# Patient Record
Sex: Female | Born: 1970 | Race: White | Hispanic: No
Health system: Southern US, Community
[De-identification: ages and names within clinical notes are randomized; demographics above are authoritative.]

## PROBLEM LIST (undated history)

## (undated) DIAGNOSIS — Z801 Family history of malignant neoplasm of trachea, bronchus and lung: Secondary | ICD-10-CM

## (undated) DIAGNOSIS — Z800 Family history of malignant neoplasm of digestive organs: Secondary | ICD-10-CM

## (undated) DIAGNOSIS — Z8049 Family history of malignant neoplasm of other genital organs: Secondary | ICD-10-CM

## (undated) DIAGNOSIS — Z8051 Family history of malignant neoplasm of kidney: Secondary | ICD-10-CM

## (undated) DIAGNOSIS — Z808 Family history of malignant neoplasm of other organs or systems: Secondary | ICD-10-CM

## (undated) DIAGNOSIS — Z803 Family history of malignant neoplasm of breast: Secondary | ICD-10-CM

## (undated) HISTORY — DX: Family history of malignant neoplasm of trachea, bronchus and lung: Z80.1

## (undated) HISTORY — DX: Family history of malignant neoplasm of other organs or systems: Z80.8

## (undated) HISTORY — DX: Family history of malignant neoplasm of other genital organs: Z80.49

## (undated) HISTORY — DX: Family history of malignant neoplasm of digestive organs: Z80.0

## (undated) HISTORY — DX: Family history of malignant neoplasm of kidney: Z80.51

## (undated) HISTORY — DX: Family history of malignant neoplasm of breast: Z80.3

---

## 2001-05-18 ENCOUNTER — Other Ambulatory Visit: Admission: RE | Admit: 2001-05-18 | Discharge: 2001-05-18 | Payer: Self-pay | Admitting: *Deleted

## 2001-08-26 ENCOUNTER — Emergency Department (HOSPITAL_COMMUNITY): Admission: EM | Admit: 2001-08-26 | Discharge: 2001-08-26 | Payer: Self-pay | Admitting: Emergency Medicine

## 2001-08-27 ENCOUNTER — Encounter: Payer: Self-pay | Admitting: Emergency Medicine

## 2003-01-04 ENCOUNTER — Other Ambulatory Visit: Admission: RE | Admit: 2003-01-04 | Discharge: 2003-01-04 | Payer: Self-pay | Admitting: Obstetrics and Gynecology

## 2003-04-23 ENCOUNTER — Inpatient Hospital Stay (HOSPITAL_COMMUNITY): Admission: AD | Admit: 2003-04-23 | Discharge: 2003-04-23 | Payer: Self-pay | Admitting: Obstetrics and Gynecology

## 2003-07-29 ENCOUNTER — Inpatient Hospital Stay (HOSPITAL_COMMUNITY): Admission: AD | Admit: 2003-07-29 | Discharge: 2003-07-29 | Payer: Self-pay | Admitting: Obstetrics and Gynecology

## 2003-07-29 ENCOUNTER — Inpatient Hospital Stay (HOSPITAL_COMMUNITY): Admission: AD | Admit: 2003-07-29 | Discharge: 2003-07-31 | Payer: Self-pay | Admitting: Obstetrics and Gynecology

## 2004-04-07 ENCOUNTER — Other Ambulatory Visit: Admission: RE | Admit: 2004-04-07 | Discharge: 2004-04-07 | Payer: Self-pay | Admitting: Obstetrics and Gynecology

## 2005-04-07 ENCOUNTER — Other Ambulatory Visit: Admission: RE | Admit: 2005-04-07 | Discharge: 2005-04-07 | Payer: Self-pay | Admitting: Obstetrics and Gynecology

## 2006-09-22 ENCOUNTER — Other Ambulatory Visit: Admission: RE | Admit: 2006-09-22 | Discharge: 2006-09-22 | Payer: Self-pay | Admitting: Obstetrics and Gynecology

## 2008-09-10 ENCOUNTER — Encounter (INDEPENDENT_AMBULATORY_CARE_PROVIDER_SITE_OTHER): Payer: Self-pay | Admitting: Obstetrics and Gynecology

## 2008-09-10 ENCOUNTER — Ambulatory Visit (HOSPITAL_COMMUNITY): Admission: RE | Admit: 2008-09-10 | Discharge: 2008-09-10 | Payer: Self-pay | Admitting: Obstetrics and Gynecology

## 2011-05-05 NOTE — Op Note (Signed)
Penny, Petersen        ACCOUNT NO.:  [id number]   MEDICAL RECORD NO.:  [id number]          PATIENT TYPE:  AMB   LOCATION:  SDC                           [facility]ILITY:  [facility]   PHYSICIAN:  Dois Davenport A. Rivard, M.D. DATE OF BIRTH:  March 22, 1971   DATE OF PROCEDURE:  09/10/2008  DATE OF DISCHARGE:                               OPERATIVE REPORT   PREOPERATIVE DIAGNOSIS:  Menometrorrhagia.   POSTOPERATIVE DIAGNOSIS:  Menometrorrhagia.   ANESTHESIA:  General by Dr. Pamalee Leyden.   PROCEDURE:  Hysteroscopy with endometrial resection and dilation and  curettage.   SURGEON:  Crist Fat. Rivard, MD   ASSISTANT:  None.   ESTIMATED BLOOD LOSS:  Minimal.   PROCEDURE:  After being informed of the planned procedure with possible  complications including bleeding, infection, and injury to uterus,  informed consent is obtained.  The patient was taken to OR #3, given  general anesthesia with mask ventilation.  She was placed in the  lithotomy position, prepped and draped in a sterile fashion.  Her  bladder was emptied with an in-and-out Foley catheter.  GYN exam reveals  an anteverted uterus, normal in size and shape and two normal adnexa.  A  weighted speculum was inserted.  Anterior lip of the cervix was grasped  with a tenaculum forceps and we proceed with a paracervical block using  Novocain 1% 20 mL in the usual fashion.  Uterus was then sounded at 8-cm  and the cervix was easily dilated using Hegar dilator until #31, which  allows easy entry of a operative hysteroscope.  With perfusion of  sorbitol at a maximum pressure of 80 mmHg, we were able to visualize the  entire endometrial cavity, which reveals two normal tubal ostia.  We do  not identify a polyp, but we do identified discrepancy between the  anterior wall endometrium, which appears much thicker than the rest of  the endometrium and also more vascularized.  Because of that we decide  to resect two strips of this endometrium to sent  separately using the  loop resectoscope.  Once this was completed, instruments were removed  and we proceed with a sharp curette of the endometrial cavity removing a  moderate amount of normal-appearing endometrium.  Hysteroscope was  reinserted to look at the uterine cavity, which was free of debris and  no active bleeding.  Instruments were removed.  Instruments and sponge  count is complete x2.  Estimated blood loss was minimal.  Water deficit was 40 mL.  The procedure was very well tolerated by the  patient who was taken to recovery room in a well and stable condition.   SPECIMEN:  Endometrial resection and endometrial curettings sent to  pathology.      Crist Fat Rivard, M.D.  Electronically Signed     SAR/MEDQ  D:  09/10/2008  T:  09/10/2008  Job:  [id number]

## 2011-05-08 NOTE — H&P (Signed)
NAME:  Penny Petersen, Penny Petersen                        ACCOUNT NO.:  [id number]   MEDICAL RECORD NO.:  [id number]                   PATIENT TYPE:  INP   LOCATION:  9119                                 [facility]ACILITY:  [facility]   PHYSICIAN:  Hal Morales, M.D.             DATE OF BIRTH:  May 24, 1971   DATE OF ADMISSION:  07/29/2003  DATE OF DISCHARGE:                                HISTORY & PHYSICAL   HISTORY:  [name] is a 40 year old gravida 3, para 2, 0, 0, 2 who  presents in active labor.  She was seen in maternity admissions earlier this  evening for labor evaluation and was discharged home after no cervical  change with ambulation times one hour.  She was 2 cm at that time.  Fetal  heart rate was reactive.  Her labor progressed quickly at home and she  returned to the hospital at approximately 11:30 p.m. at 5 cm dilated.  She  reported positive fetal movement, no bleeding, no rupture of membranes, no  PIH symptoms.  Her pregnancy was followed by the CNM service at [facility] in room  number one.   1. Intrauterine insemination for pregnancy with anonymous donor.  2. Rh negative.  3. History of abnormal Pap and cryotherapy.  4. Smoker.  5. Group B strep negative.   This patient was initially evaluated at the office of [facility] on November 30, 2002 at approximately eight weeks gestation.  EDC determined by dates.  Pregnancy was accomplished with intrauterine insemination for a female  partner couple with an anonymous donor.  Her pregnancy has been essentially  unremarkable.  She has been size equal to dates throughout.  Normotensive  with no proteinuria.   PRENATAL LAB WORK:  On January 05, 2003: Hemoglobin and hematocrit 13.2 and  39.0, platelets 262,000.  Blood type and Rh O negative, antibody screen  negative.  VDRL nonreactive.  Rubella immune.  Hepatitis B surface antigen  negative.  HIV declined.  CF testing declined.  Pap smear within normal  limits.  GC and Chlamydia negative.  Quad  screen within normal range.  One  hour glucose challenge 163, three hour GTT within normal limits.  Hemoglobin  at 28 weeks 12.1.  At 36 weeks culture of the vaginal tract was negative for  group B strep.   OB HISTORY:  In 1990 patient had a normal spontaneous vaginal delivery with  birth of a 7 pound, 10 ounce female infant at term named Penny Petersen with no  complications; in 1993 patient had a normal spontaneous vaginal delivery  with birth of an 8 pound, 2 ounce female infant named Penny Petersen at term with no  complications, and the present pregnancy.  As stated above patient use  Clomid and achieved pregnancy with intrauterine insemination.  She has a  history of abnormal Pap smear and cryotherapy.  History of Chlamydia in 1989  which was treated.   FAMILY HISTORY:  Maternal grandfather with a history of heart disease.  Maternal aunt with a history of positive PPD and INH treatments.  Maternal  grandmother with diabetes and anemia and she receives vitamin B12 shots.  Patient's mother with hypothyroidism.  The patient's mother diagnosed with  questionable rash, question of autoimmune disease.  Paternal grandfather -  lung cancer.  Maternal grandfather - skin cancer.  The patient's father with  skin cancer lesions on chest.  The patient has a half-brother with epilepsy.  The patient's mother and brother with depression.   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  Wisdom teeth.   GENETIC HISTORY:  The patient's half-brother with cerebral palsy and mental  retardation.  Patient's half-brother with extra fingers.  Maternal aunt born  without one kidney.   HABITS:  The patient smokes one to three cigarettes a day and denies use of  alcohol or illicit drugs.   SOCIAL HISTORY:  [name] is a 40 year old Caucasian female who has a  female partner named Penny Petersen who is very supportive.  They are [place] in  their faith.    ALLERGIES:  The patient has no known drug allergies.   REVIEW OF SYSTEMS:  The patient was admitted to the hospital in active labor  at 5 cm and quickly progressed to normal spontaneous vaginal delivery with  the birth of a viable female infant named Penny Petersen, with Apgar's scores of 8  at one minute and 9 at five minutes.   PHYSICAL EXAMINATION:  VITAL SIGNS: Stable.  Afebrile.  HEENT: Unremarkable.  HEART: Regular rate and rhythm.  LUNGS: Clear.  ABDOMEN: Gravid in its contour.  PELVIC EXAM: Uterine fundus was noted to extend 39 cm above the level of the  pubic symphysis, Leopold's maneuvers found the infant to be in a  longitudinal lie, cephalic presentation and the estimated fetal weight was  7.5 pounds.  The fetal heart rate was in the 130's with positive  variability, positive accelerations.  The patient was contracting regularly  every two to three minutes.  Spontaneous rupture of membranes occurred at  0025 Hr. With clear fluid.  The patient immediately experienced a strong  urge to push and with two pushes went into normal spontaneous vaginal  delivery.   DIAGNOSIS:  Intrauterine pregnancy at term, delivered.   PROCEDURE:  Normal spontaneous vaginal delivery.   PLAN:  Admit with routine orders.  Mother and infant in stable condition.     Rica Koyanagi, C.N.M.               Hal Morales, M.D.    SDM/MEDQ  D:  07/30/2003  T:  07/30/2003  Job:  [id number]

## 2011-09-21 LAB — CBC
HCT: 45.2
Hemoglobin: 15
MCHC: 33.3
MCV: 101.2 — ABNORMAL HIGH
Platelets: 288
RBC: 4.46
RDW: 11.9
WBC: 5.8

## 2011-09-21 LAB — PREGNANCY, URINE: Preg Test, Ur: NEGATIVE

## 2019-01-20 ENCOUNTER — Other Ambulatory Visit: Payer: Self-pay | Admitting: Obstetrics and Gynecology

## 2019-01-20 DIAGNOSIS — R7989 Other specified abnormal findings of blood chemistry: Secondary | ICD-10-CM

## 2019-01-20 DIAGNOSIS — R891 Abnormal level of hormones in specimens from other organs, systems and tissues: Principal | ICD-10-CM

## 2019-01-24 ENCOUNTER — Other Ambulatory Visit: Payer: Self-pay | Admitting: Obstetrics and Gynecology

## 2019-01-24 DIAGNOSIS — R891 Abnormal level of hormones in specimens from other organs, systems and tissues: Secondary | ICD-10-CM

## 2019-01-26 ENCOUNTER — Ambulatory Visit
Admission: RE | Admit: 2019-01-26 | Discharge: 2019-01-26 | Disposition: A | Discharge status: Home / Self Care | Payer: Self-pay | Source: Ambulatory Visit | Attending: Obstetrics and Gynecology | Admitting: Obstetrics and Gynecology

## 2019-01-26 DIAGNOSIS — R891 Abnormal level of hormones in specimens from other organs, systems and tissues: Secondary | ICD-10-CM

## 2019-01-26 MED ORDER — IOPAMIDOL (ISOVUE-370) INJECTION 76%
75.0000 mL | Freq: Once | INTRAVENOUS | Status: AC | PRN
  Administered 2019-01-26: 75 mL via INTRAVENOUS

## 2019-07-08 IMAGING — CT CT HEAD WO/W CM
4 of 5 series · 16 of 47 positions shown, 18 images · IV contrast (iopamidol)
Comparison: None.

CLINICAL DATA: Elevated prolactin level.

EXAM:
CT HEAD WITHOUT AND WITH CONTRAST
TECHNIQUE: Contiguous axial images were obtained from the base of the skull
through the vertex without and with intravenous contrast
CONTRAST:  75mL O1VNQ3-S24 IOPAMIDOL (O1VNQ3-S24) INJECTION 76%

[Series 2: brain 5.00 hr40 s3 ibhc · axial · 0.39mm/px · z∈[-586,-471]mm · 8 of 31 slices shown, 10 images]
[im 4/31  brain]
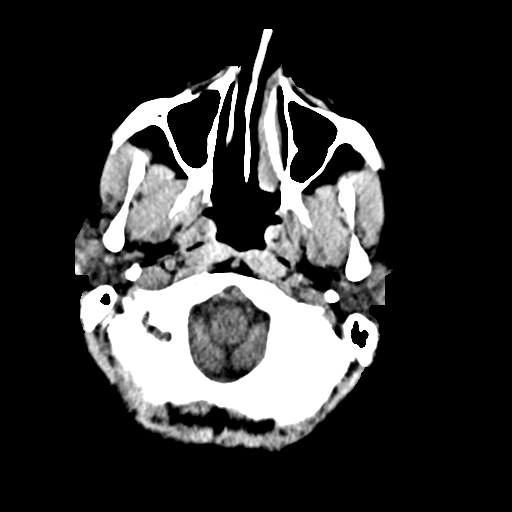
[im 4/31  bone]
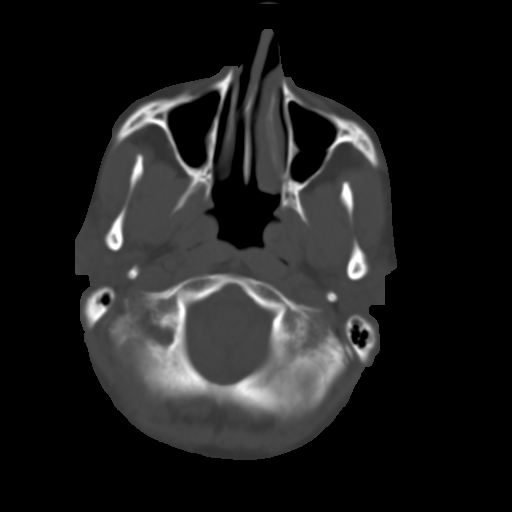
[im 7/31  brain]
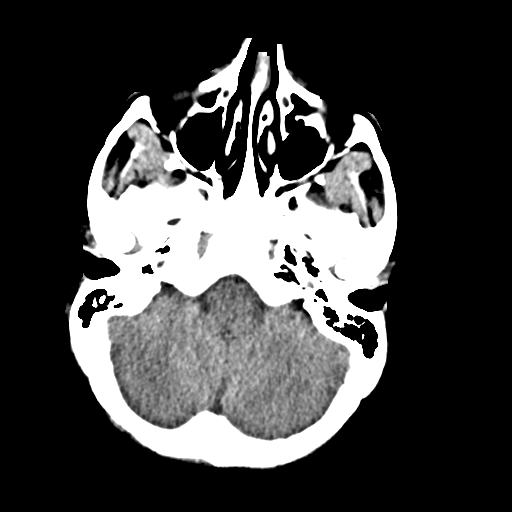
[im 11/31  brain]
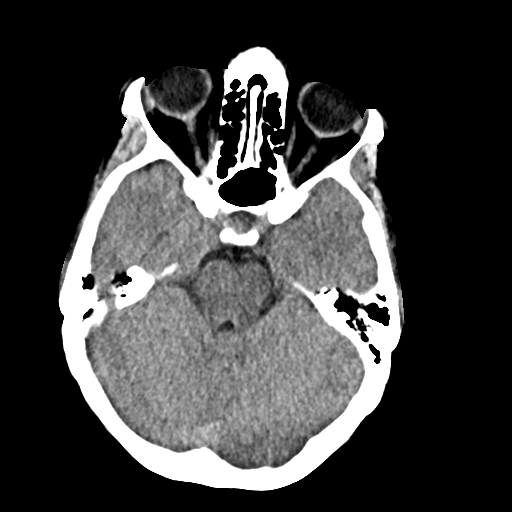
[im 14/31  brain]
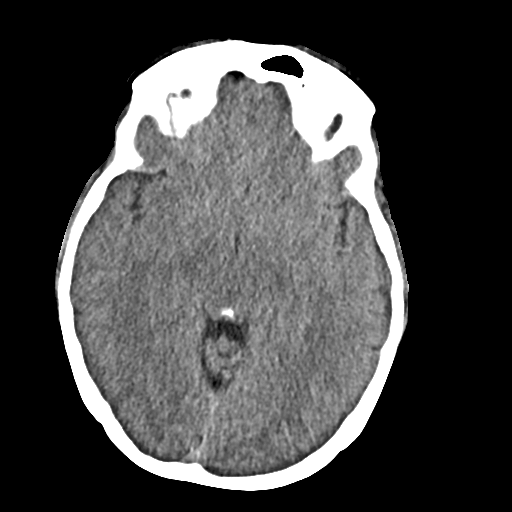
[im 17/31  brain]
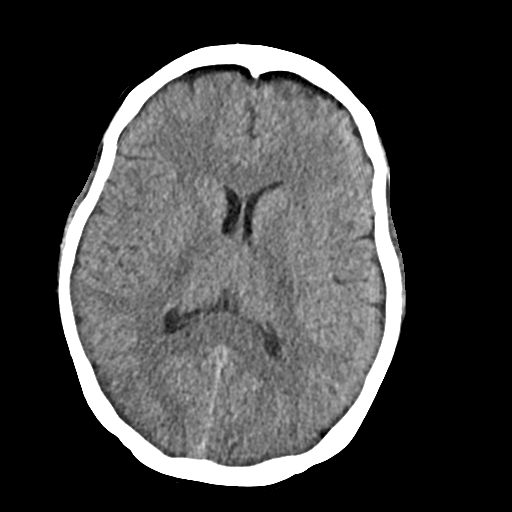
[im 17/31  bone]
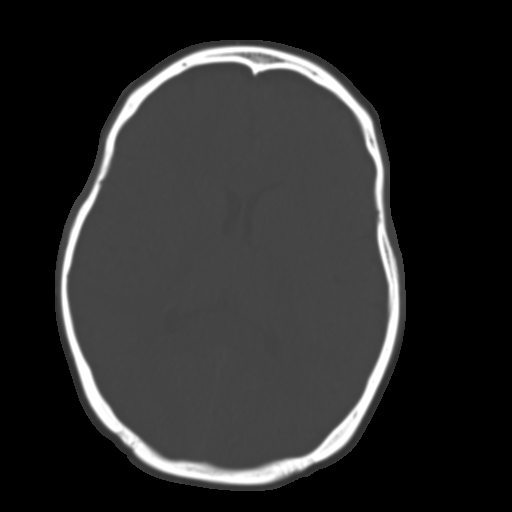
[im 21/31  brain]
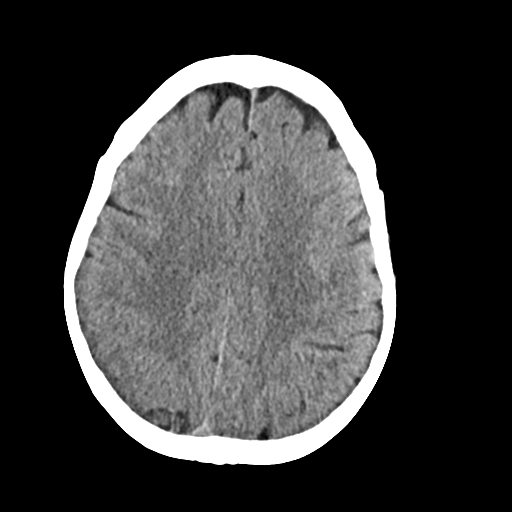
[im 24/31  brain]
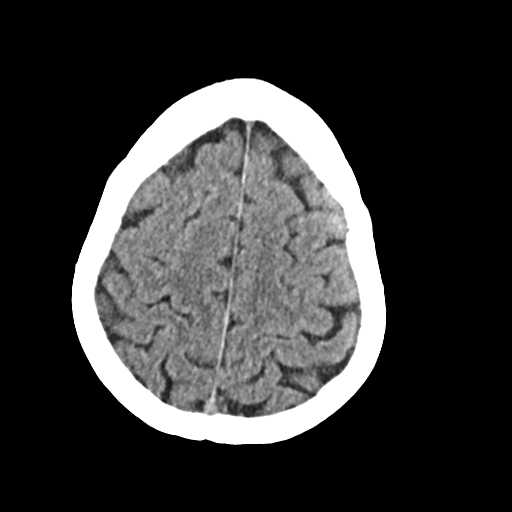
[im 27/31  brain]
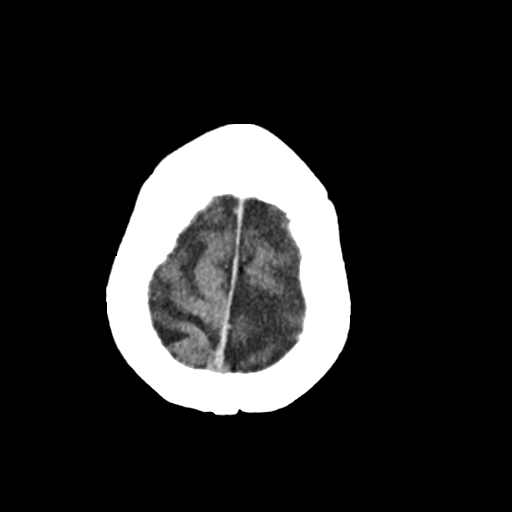

[Series 3: brain 5.00 hr60 s3 · axial · 0.39mm/px · z∈[-586,-571]mm · 2 of 31 slices shown]
[im 4/31  brain]
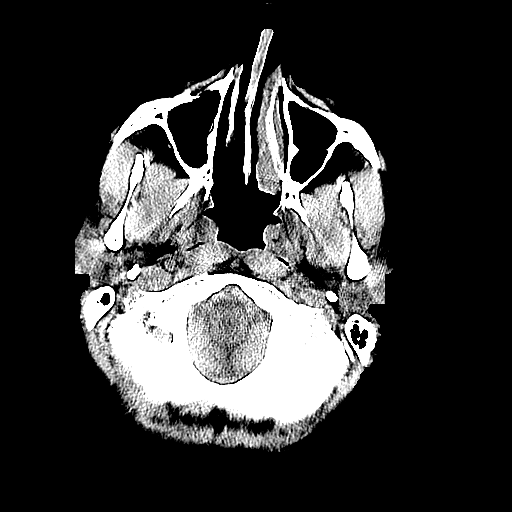
[im 7/31  brain]
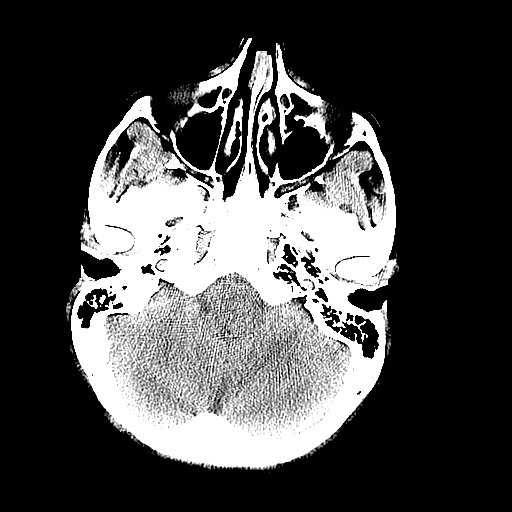

[Series 5: brain 3.00 hr40 s3 cor ibhc · coronal · 0.30mm/px · 3 of 65 slices shown]
[im 22/65  brain]
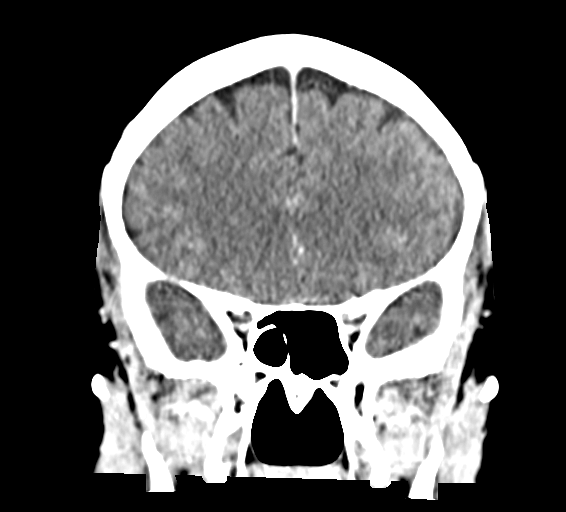
[im 29/65  brain]
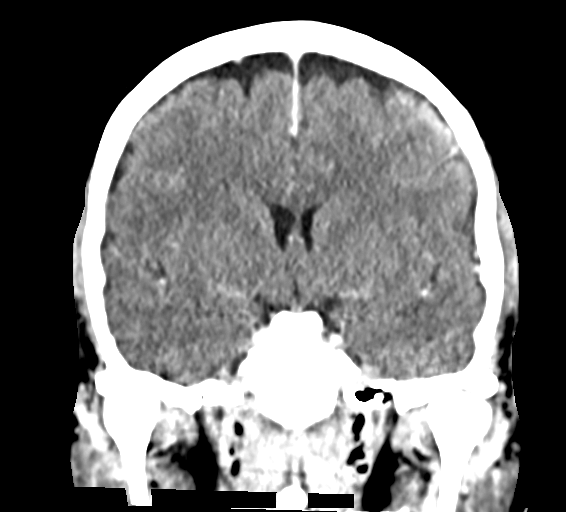
[im 36/65  brain]
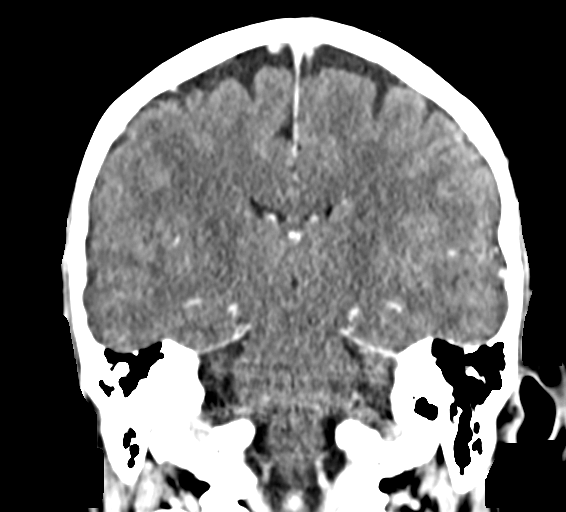

[Series 7: brain 3.00 hr40 s3 sag ibhc · sagittal · 0.30mm/px · 3 of 56 slices shown]
[im 19/56  brain]
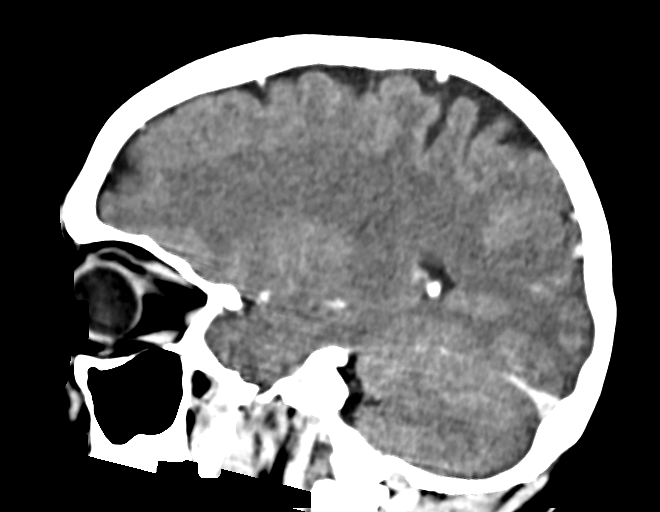
[im 28/56  brain]
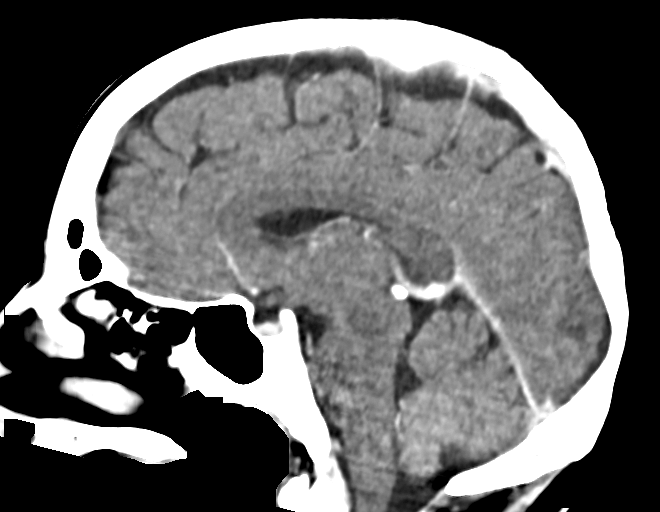
[im 37/56  brain]
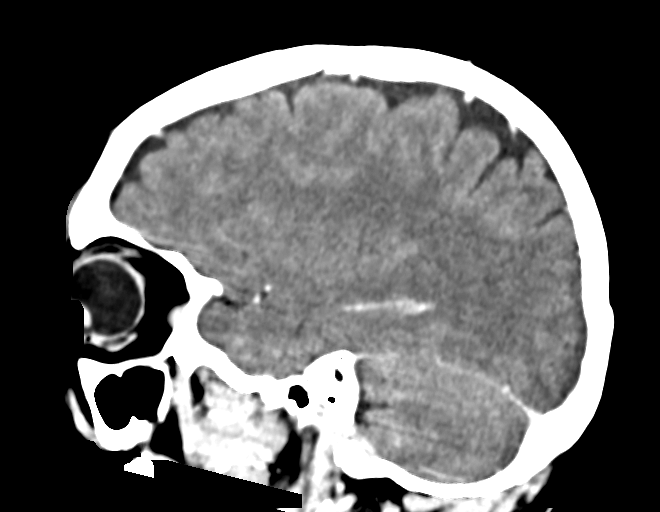

[16 of 47 positions shown; findings below may reference images not displayed]

FINDINGS: Brain: There is no evidence of acute infarct, intracranial
hemorrhage, mass, midline shift, or extra-axial fluid collection.
The ventricles and sulci are normal.

The pituitary gland is normal in size, and no sellar or suprasellar
mass is identified. Assessment for a small pituitary microadenoma is
limited on CT. The infundibulum is midline.

Vascular: Major dural venous sinuses and large arteries at the base
of the brain are grossly patent.

Skull: No fracture or focal osseous lesion.

Sinuses/Orbits: Unremarkable orbits. No evidence of significant
paranasal sinus inflammatory disease or significant mastoid fluid.

Other: Unremarkable.
IMPRESSION: Negative head CT.

Overall normal pituitary size without a pituitary macroadenoma or
other sellar region mass identified. Limited assessment for
microadenoma on CT with pituitary protocol brain MRI being the
preferred examination.

## 2019-11-07 ENCOUNTER — Telehealth: Payer: Self-pay | Admitting: Genetic Counselor

## 2019-11-07 NOTE — Telephone Encounter (Signed)
I received a genetic counseling referral from Dr. Collene Mares at [facility] for a family history of malignant neoplasm of digestive organs. Mrs. Penny Petersen has been cld and scheduled to see Raquel Sarna on 11/24 at [facility]. Pt aware to arrive 15 minutes early.

## 2019-11-10 ENCOUNTER — Telehealth: Payer: Self-pay | Admitting: Genetic Counselor

## 2019-11-10 NOTE — Telephone Encounter (Signed)
Called patient regarding upcoming Web[facility] appointment, per patient's request this will be a walk-in visit. °

## 2019-11-14 ENCOUNTER — Inpatient Hospital Stay: Payer: BC Managed Care – PPO | Admitting: Genetic Counselor

## 2019-11-14 ENCOUNTER — Other Ambulatory Visit: Payer: Self-pay | Admitting: Genetic Counselor

## 2019-11-14 ENCOUNTER — Telehealth: Payer: Self-pay | Admitting: Genetic Counselor

## 2019-11-14 ENCOUNTER — Inpatient Hospital Stay: Payer: BC Managed Care – PPO

## 2019-11-14 DIAGNOSIS — Z809 Family history of malignant neoplasm, unspecified: Secondary | ICD-10-CM

## 2019-11-14 NOTE — Telephone Encounter (Signed)
Returned patient's phone call regarding rescheduling 11/24 appointment, per patient's request appointment has moved to 12/01.

## 2019-11-20 ENCOUNTER — Telehealth: Payer: Self-pay | Admitting: Genetic Counselor

## 2019-11-20 NOTE — Telephone Encounter (Signed)
Returned patient's phone call regarding rescheduling 12/01 appointment, per patient's request appointment has moved to 12/03.

## 2019-11-21 ENCOUNTER — Inpatient Hospital Stay: Payer: BC Managed Care – PPO

## 2019-11-21 ENCOUNTER — Inpatient Hospital Stay: Payer: BC Managed Care – PPO | Admitting: Genetic Counselor

## 2019-11-23 ENCOUNTER — Inpatient Hospital Stay: Payer: BC Managed Care – PPO | Attending: Genetic Counselor | Admitting: Genetic Counselor

## 2019-11-23 ENCOUNTER — Other Ambulatory Visit: Payer: Self-pay

## 2019-11-23 ENCOUNTER — Encounter: Payer: Self-pay | Admitting: Genetic Counselor

## 2019-11-23 ENCOUNTER — Inpatient Hospital Stay: Payer: BC Managed Care – PPO

## 2019-11-23 DIAGNOSIS — Z808 Family history of malignant neoplasm of other organs or systems: Secondary | ICD-10-CM

## 2019-11-23 DIAGNOSIS — Z801 Family history of malignant neoplasm of trachea, bronchus and lung: Secondary | ICD-10-CM

## 2019-11-23 DIAGNOSIS — Z8051 Family history of malignant neoplasm of kidney: Secondary | ICD-10-CM

## 2019-11-23 DIAGNOSIS — Z803 Family history of malignant neoplasm of breast: Secondary | ICD-10-CM | POA: Insufficient documentation

## 2019-11-23 DIAGNOSIS — Z800 Family history of malignant neoplasm of digestive organs: Secondary | ICD-10-CM

## 2019-11-23 DIAGNOSIS — Z8049 Family history of malignant neoplasm of other genital organs: Secondary | ICD-10-CM | POA: Insufficient documentation

## 2019-11-23 NOTE — Progress Notes (Signed)
REFERRING PROVIDER: Charna ElizabethMann, Jyothi, MD [address] [place] [id number]  PRIMARY PROVIDER:  Roger KillWilliams, Breejante J, PA-C  PRIMARY REASON FOR VISIT:  1. Family history of colon cancer   2. Family history of throat cancer   3. Family history of lung cancer   4. Family history of bone cancer   5. Family history of breast cancer   6. Family history of uterine cancer   7. Family history of kidney cancer   8. Family history of melanoma      HISTORY OF PRESENT ILLNESS:   [name], a 48 y.o. female, was seen for a [facility] cancer genetics consultation at the request of Dr. Loreta AveMann due to a family history of cancer.  [name] presents to clinic today to discuss the possibility of a hereditary predisposition to cancer, genetic testing, and to further clarify her future cancer risks, as well as potential cancer risks for family members.   [name] is a 48 y.o. female with no personal history of cancer. She did recently have a colonoscopy which resulted in the removal of 2 polyps. She is currently waiting on the pathology results.  CANCER HISTORY:  Oncology History   No history exists.     RISK FACTORS:  Menarche was at age 48.  First live birth at age 48.  OCP use for approximately 10 years on and off.  Ovaries intact: yes.  Hysterectomy: no.  Menopausal status: perimenopausal.  HRT use: 0 years. Colonoscopy: yes; patient is awaiting pathology for 2 polyps. Mammogram within the last year: yes. Number of breast biopsies: 1. Any excessive radiation exposure in the past: no  Past Medical History:  Diagnosis Date  . Family history of bone cancer   . Family history of breast cancer   . Family history of colon cancer   . Family history of kidney cancer   . Family history of lung cancer   . Family history of melanoma   . Family history of throat cancer   . Family history of uterine cancer     Social History   Socioeconomic History  .  Marital status: Single    Spouse name: Not on file  . Number of children: Not on file  . Years of education: Not on file  . Highest education level: Not on file  Occupational History  . Not on file  Social Needs  . Financial resource strain: Not on file  . Food insecurity    Worry: Not on file    Inability: Not on file  . Transportation needs    Medical: Not on file    Non-medical: Not on file  Tobacco Use  . Smoking status: Not on file  Substance and Sexual Activity  . Alcohol use: Not on file  . Drug use: Not on file  . Sexual activity: Not on file  Lifestyle  . Physical activity    Days per week: Not on file    Minutes per session: Not on file  . Stress: Not on file  Relationships  . Social Musicianconnections    Talks on phone: Not on file    Gets together: Not on file    Attends religious service: Not on file    Active member of club or organization: Not on file    Attends meetings of clubs or organizations: Not on file    Relationship status: Not on file  Other Topics Concern  . Not on file  Social History  Narrative  . Not on file     FAMILY HISTORY:  We obtained a detailed, 4-generation family history.  Significant diagnoses are listed below: Family History  Problem Relation Age of Onset  . Colon polyps Father        pre-cancerous  . Colon cancer Maternal Grandmother 50  . Skin cancer Maternal Grandfather 60  . Bone cancer Other        dx. <25; paternal great-aunt  . Alcoholism Brother   . Uterine cancer Maternal Great-grandmother   . Kidney cancer Maternal Great-grandmother        "kidney stem"  . Melanoma Cousin 16       x2  . Bone cancer Other        dx. 30s; paternal great-aunt  . Bone cancer Other        dx. 89s; paternal great-aunt  . Breast cancer Other        dx. 50s or 60s; paternal great-aunt    Penny Petersen has three children - a son who is 44, a daughter who is 33, and another daughter who is 92. She has two full brothers - one who died  at age 74, one maternal half-brother who died at age 52 who had intellectual disability and cerebral palsy, and two paternal half-siblings (a brother and sister) who are in their 69s and 14s. None of these individuals nor any of their children have had cancer.  Penny Petersen's mother is currently living at age 2 and has not had cancer. She has three maternal uncles (one adopted) and one maternal aunt, none of whom have had cancer. She has one maternal cousin who had melanoma when she was 58 or 81, and may have had melanoma twice. Her maternal grandmother died from colon cancer at age 71, and her maternal grandfather died at age 69 and had skin cancer in his 60s. Penny Petersen's maternal great-grandmother (grandmother's mother) also had uterine and kidney cancer, and her grandfather had a brother with an unspecified type of cancer.  Penny Petersen's father is currently living at age 53. He has a history of pre-cancerous colon polyps, although Penny Petersen is unsure how many he has had. She has one paternal uncle who may have cancer, but she isn't sure. Her paternal grandfather had lung cancer diagnosed at age 56, and her paternal grandmother had throat cancer diagnosed at age 73. Of note, there is also a significant history of cancer among her grandmother's siblings: three great-aunts had bone cancer, one diagnosed younger than 25 in her arm/elbow, one diagnosed in her 30s in her leg/hip, and one diagnosed in her 55s in her jaw. Another great-aunt had breast cancer diagnosed in her 28s or 56s.   Penny Petersen is unaware of previous family history of genetic testing for hereditary cancer risks.   GENETIC COUNSELING ASSESSMENT: Penny Petersen is a 48 y.o. female with a family history of cancer which is not highly suggestive of a hereditary cancer syndrome in Penny Petersen. We, therefore, discussed and recommended the following at today's visit.   DISCUSSION: We discussed that the majority  of cancer is sporadic. Approximatley 5-10% of cancer is hereditary.  Specifically regarding the history of cancer in her great-aunts, we discussed that sometimes bone and breast cancer diagnosed at young ages can be indicative of a genetic condition called Li-Fraumeni syndrome (LFS).  LFS is associated with many different types of cancer diagnosed in multiple generations at young ages (often younger than 58).  We discussed that, in  some cases, testing can be beneficial for knowing about cancer risks, identifying potential screening and risk-reduction options that may be appropriate, and to understand if other family members could be at risk for cancer and allow them to undergo genetic testing.  We reviewed the characteristics, features and inheritance patterns of hereditary cancer syndromes.  We discussed with Penny Petersen that she does not meet insurance or NCCN criteria for genetic testing based on the family history and, therefore, it is not highly suggestive that she is affected with a familial hereditary cancer syndrome.  We feel she is at low risk to harbor a gene mutation associated with such a condition. Thus, we did not recommend any genetic testing, at this time, and recommended Penny Petersen continue to follow the cancer screening guidelines given by her primary healthcare provider.  Based on Penny Petersen's personal and family history, a statistical model Midwife) was used to estimate her risk of developing breast cancer. This estimates her lifetime risk of developing breast cancer to be approximately 9.5%. This estimation does not consider any genetic testing results.  Her lifetime breast cancer risk is a preliminary estimate based on available information using one of several models endorsed by the [place] (ACS). The ACS recommends consideration of breast MRI screening as an adjunct to mammography for patients at high risk (defined as 20% or greater lifetime risk).     PLAN:  Penny Petersen did not wish to pursue genetic testing at today's visit. We understand this decision and remain available to coordinate genetic testing at any time in the future. We, therefore, recommend Penny Petersen continue to follow the cancer screening guidelines given by her primary healthcare provider.  Lastly, we encouraged Penny Petersen to remain in contact with cancer genetics so that we can continuously update the family history and inform her of any changes in cancer genetics and testing that may be of benefit for this family.   Penny Petersen's questions were answered to her satisfaction today. Our contact information was provided should additional questions or concerns arise. Thank you for the referral and allowing [place] to share in the care of your patient.   Clint Guy, MS, [facility] Certified Genetic Counselor [email or web address] Phone: [phone]  The patient was seen for a total of 35 minutes in face-to-face genetic counseling.  This patient was discussed with Drs. Magrinat, Lindi Adie and/or Burr Medico who agrees with the above.    _______________________________________________________________________ For Office Staff:  Number of people involved in session: 1 Was an Intern/ student involved with case: no

## 2020-08-23 ENCOUNTER — Ambulatory Visit: Payer: Self-pay | Admitting: *Deleted
